# Patient Record
Sex: Male | Born: 1980 | Hispanic: No | Marital: Married | Smoking: Former smoker
Health system: Southern US, Community
[De-identification: ages and names within clinical notes are randomized; demographics above are authoritative.]

---

## 2007-03-05 ENCOUNTER — Ambulatory Visit: Payer: Self-pay | Admitting: Internal Medicine

## 2007-03-05 LAB — CONVERTED CEMR LAB
BUN: 12 mg/dL (ref 6–23)
CO2: 33 meq/L — ABNORMAL HIGH (ref 19–32)
Calcium: 9.8 mg/dL (ref 8.4–10.5)
Creatinine, Ser: 0.8 mg/dL (ref 0.4–1.5)
Glucose, Bld: 87 mg/dL (ref 70–99)
TSH: 1.05 microintl units/mL (ref 0.35–5.50)

## 2016-01-01 ENCOUNTER — Emergency Department (HOSPITAL_COMMUNITY)
Admission: EM | Admit: 2016-01-01 | Discharge: 2016-01-01 | Disposition: A | Discharge status: Home / Self Care | Payer: Self-pay | Attending: Emergency Medicine | Admitting: Emergency Medicine

## 2016-01-01 ENCOUNTER — Encounter (HOSPITAL_COMMUNITY): Payer: Self-pay

## 2016-01-01 DIAGNOSIS — K029 Dental caries, unspecified: Secondary | ICD-10-CM | POA: Insufficient documentation

## 2016-01-01 DIAGNOSIS — Z87891 Personal history of nicotine dependence: Secondary | ICD-10-CM | POA: Insufficient documentation

## 2016-01-01 DIAGNOSIS — K0889 Other specified disorders of teeth and supporting structures: Secondary | ICD-10-CM

## 2016-01-01 MED ORDER — TRAMADOL HCL 50 MG PO TABS: 50.0000 mg | ORAL_TABLET | Freq: Four times a day (QID) | ORAL | Status: DC | PRN

## 2016-01-01 MED ORDER — AMOXICILLIN 500 MG PO CAPS
500.0000 mg | ORAL_CAPSULE | Freq: Once | ORAL | Status: AC
  Administered 2016-01-01: 500 mg via ORAL
  Filled 2016-01-01: qty 1

## 2016-01-01 MED ORDER — AMOXICILLIN 500 MG PO CAPS: 500.0000 mg | ORAL_CAPSULE | Freq: Three times a day (TID) | ORAL | Status: DC

## 2016-01-01 MED ORDER — TRAMADOL HCL 50 MG PO TABS
50.0000 mg | ORAL_TABLET | Freq: Once | ORAL | Status: AC
  Administered 2016-01-01: 50 mg via ORAL
  Filled 2016-01-01: qty 1

## 2016-01-01 NOTE — ED Notes (Signed)
Pt here for dental pain to one of his left upper molars. States there is hole in it.

## 2016-01-01 NOTE — ED Provider Notes (Signed)
CSN: [id number]     Arrival date & time 01/01/16  0111 History  By signing my name below, I, Freida Busman, attest that this documentation has been prepared under the direction and in the presence of Gilda Crease, MD . Electronically Signed: Freida Busman, Scribe. 01/01/2016. 2:30 AM.    Chief Complaint  Patient presents with  . Dental Pain   The history is provided by the patient. No language interpreter was used.     HPI Comments:  Henry Howard is a 35 y.o. male who presents to the Emergency Department complaining of left upper dental pain x 4 days; notes the pain waxes and wanes in severity. Pt has appointment with dentist scheduled for  01/07/16. No alleviating factors noted or associated symptoms noted.   History reviewed. No pertinent past medical history. History reviewed. No pertinent past surgical history. No family history on file. Social History  Substance Use Topics  . Smoking status: Former Games developer  . Smokeless tobacco: None  . Alcohol Use: No    Review of Systems  Constitutional: Negative for fever.  HENT: Positive for dental problem.     Allergies  Review of patient's allergies indicates no known allergies.  Home Medications   Prior to Admission medications   Medication Sig Start Date End Date Taking? Authorizing Provider  amoxicillin (AMOXIL) 500 MG capsule Take 1 capsule (500 mg total) by mouth 3 (three) times daily. 01/01/16   Gilda Crease, MD  traMADol (ULTRAM) 50 MG tablet Take 1 tablet (50 mg total) by mouth every 6 (six) hours as needed. 01/01/16   Gilda Crease, MD   BP 157/92 mmHg  Pulse 84  Temp(Src) 98.6 F (37 C)  Resp 16  SpO2 100% Physical Exam  Constitutional: He is oriented to person, place, and time. He appears well-developed and well-nourished. No distress.  HENT:  Head: Normocephalic and atraumatic.  Right Ear: Hearing normal.  Left Ear: Hearing normal.  Nose: Nose normal.  Mouth/Throat: Oropharynx is clear  and moist and mucous membranes are normal.    Eyes: Conjunctivae and EOM are normal. Pupils are equal, round, and reactive to light.  Neck: Normal range of motion. Neck supple.  Cardiovascular: Regular rhythm, S1 normal and S2 normal.  Exam reveals no gallop and no friction rub.   No murmur heard. Pulmonary/Chest: Effort normal and breath sounds normal. No respiratory distress. He exhibits no tenderness.  Abdominal: Soft. Normal appearance and bowel sounds are normal. There is no hepatosplenomegaly. There is no tenderness. There is no rebound, no guarding, no tenderness at McBurney's point and negative Murphy's sign. No hernia.  Musculoskeletal: Normal range of motion.  Neurological: He is alert and oriented to person, place, and time. He has normal strength. No cranial nerve deficit or sensory deficit. Coordination normal. GCS eye subscore is 4. GCS verbal subscore is 5. GCS motor subscore is 6.  Skin: Skin is warm, dry and intact. No rash noted. No cyanosis.  Psychiatric: He has a normal mood and affect. His speech is normal and behavior is normal. Thought content normal.  Nursing note and vitals reviewed.   ED Course  Procedures   DIAGNOSTIC STUDIES:  Oxygen Saturation is 100% on RA, normal by my interpretation.    COORDINATION OF CARE:  2:02 AM Will discharge with antibiotic. Advised pt to keep dental appointment.  Discussed treatment plan with pt at bedside and pt agreed to plan.    MDM   Final diagnoses:  Toothache    Presents  with toothache. Exam shows cary with no abscess. Has f/u with dentist. Treat with Amox, ultram.  I personally performed the services described in this documentation, which was scribed in my presence. The recorded information has been reviewed and is accurate.    Gilda Crease, MD 01/01/16 0230

## 2016-06-24 ENCOUNTER — Ambulatory Visit: Payer: Self-pay

## 2016-06-24 ENCOUNTER — Other Ambulatory Visit: Payer: Self-pay | Admitting: Occupational Medicine

## 2016-06-24 DIAGNOSIS — Z0000 Encounter for general adult medical examination without abnormal findings: Secondary | ICD-10-CM

## 2017-05-08 IMAGING — CR DG CHEST 1V
1 series · 1 of 1 positions shown · non-contrast
Comparison: None.

CLINICAL DATA: Pre-employment physical

EXAM:
CHEST 1 VIEW

[view not recorded]
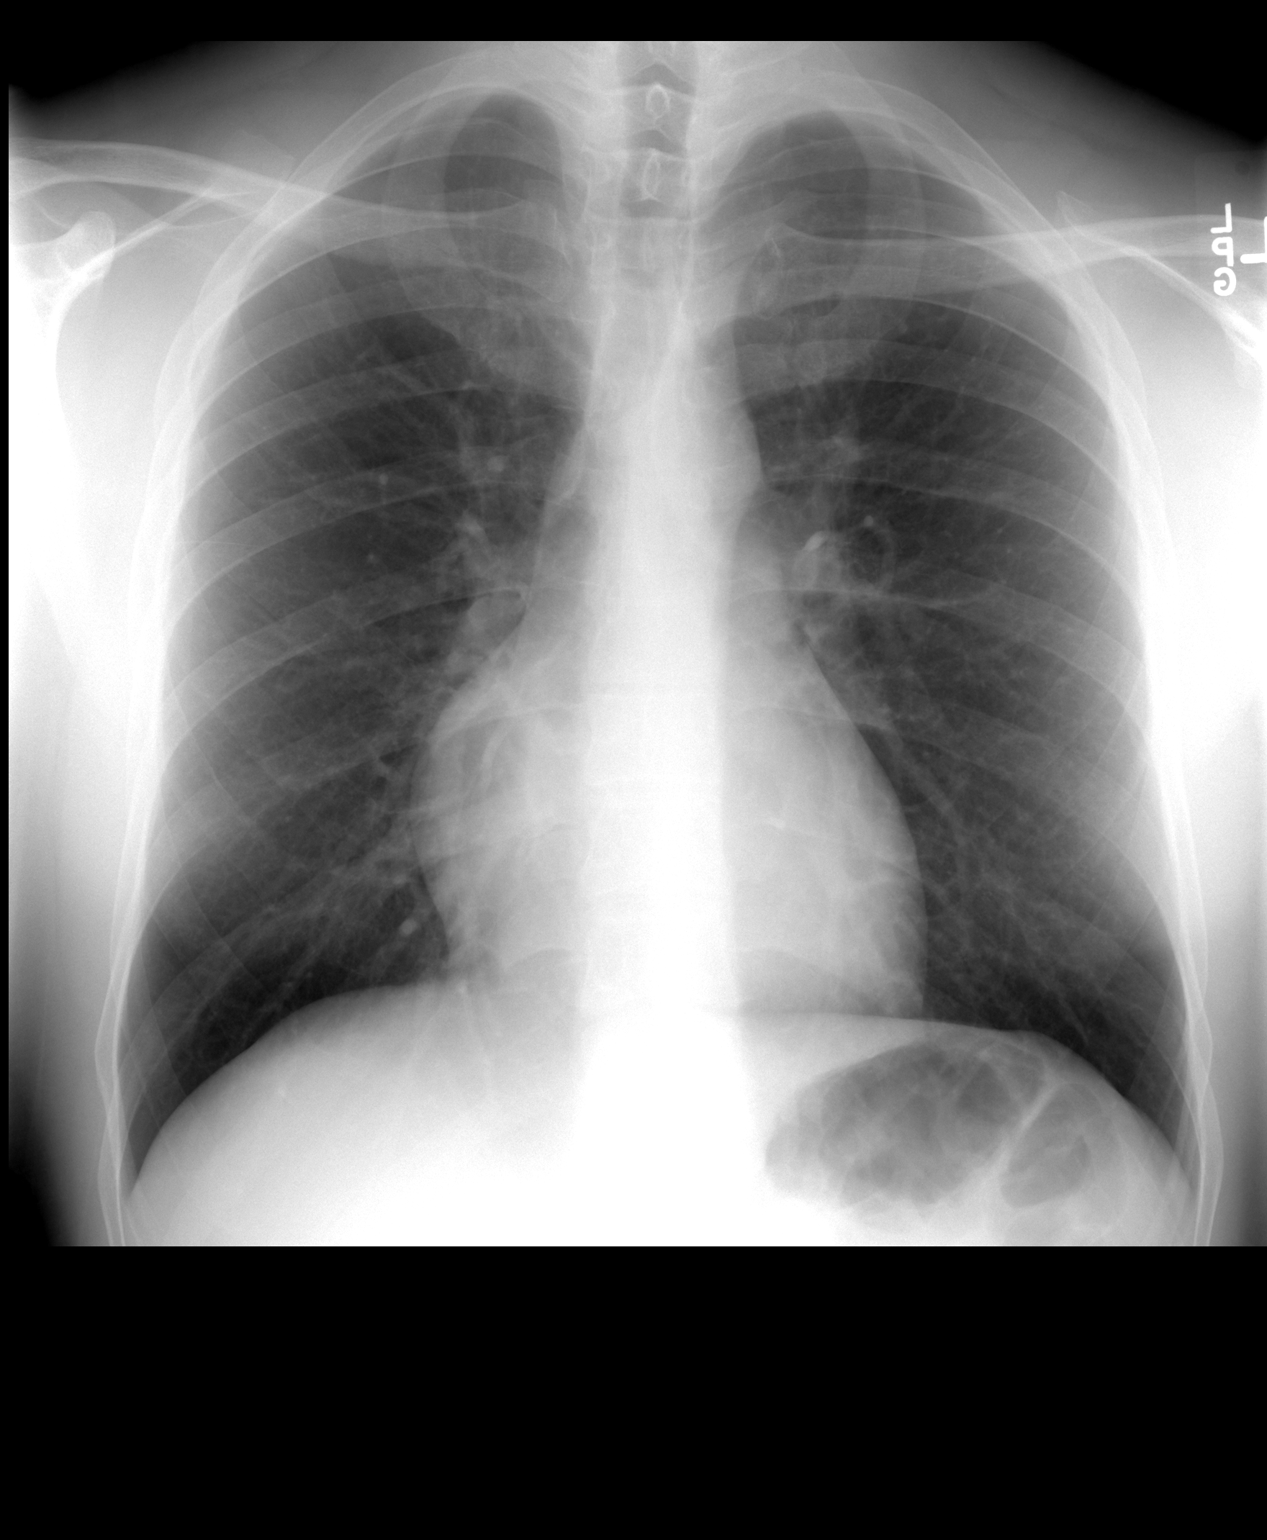

[1 of 1 positions shown; findings below may reference images not displayed]

FINDINGS: Lungs are clear.  No pleural effusion or pneumothorax.

The heart is normal in size.
IMPRESSION: No evidence of acute cardiopulmonary disease.

## 2017-11-23 DIAGNOSIS — J3489 Other specified disorders of nose and nasal sinuses: Secondary | ICD-10-CM | POA: Diagnosis not present

## 2017-11-23 DIAGNOSIS — H9192 Unspecified hearing loss, left ear: Secondary | ICD-10-CM | POA: Diagnosis not present

## 2017-11-23 DIAGNOSIS — K219 Gastro-esophageal reflux disease without esophagitis: Secondary | ICD-10-CM | POA: Diagnosis not present

## 2018-04-03 DIAGNOSIS — Z0000 Encounter for general adult medical examination without abnormal findings: Secondary | ICD-10-CM | POA: Diagnosis not present

## 2018-04-03 DIAGNOSIS — R7301 Impaired fasting glucose: Secondary | ICD-10-CM | POA: Diagnosis not present

## 2018-04-03 DIAGNOSIS — Z13220 Encounter for screening for lipoid disorders: Secondary | ICD-10-CM | POA: Diagnosis not present

## 2019-06-10 DIAGNOSIS — Z3141 Encounter for fertility testing: Secondary | ICD-10-CM | POA: Diagnosis not present

## 2019-10-24 DIAGNOSIS — M25561 Pain in right knee: Secondary | ICD-10-CM | POA: Diagnosis not present

## 2019-10-24 DIAGNOSIS — M25562 Pain in left knee: Secondary | ICD-10-CM | POA: Diagnosis not present

## 2019-11-22 DIAGNOSIS — M25562 Pain in left knee: Secondary | ICD-10-CM | POA: Diagnosis not present

## 2019-11-22 DIAGNOSIS — M25561 Pain in right knee: Secondary | ICD-10-CM | POA: Diagnosis not present

## 2020-02-04 DIAGNOSIS — H5203 Hypermetropia, bilateral: Secondary | ICD-10-CM | POA: Diagnosis not present

## 2021-11-11 NOTE — Progress Notes (Signed)
Anesthesia Review:  PCP: Cardiologist : Chest x-ray : EKG : Echo : Stress test: Cardiac Cath :  Activity level:  Sleep Study/ CPAP : Fasting Blood Sugar :      / Checks Blood Sugar -- times a day:   Blood Thinner/ Instructions /Last Dose: ASA / Instructions/ Last Dose :  

## 2021-11-11 NOTE — Progress Notes (Signed)
Your procedure is scheduled on:                11/18/2021   Report to [facility] Main  Entrance   Report to admitting at    1030AM     Call this number if you have problems the morning of surgery [phone]    REMEMBER: NO  SOLID FOOD CANDY OR GUM AFTER MIDNIGHT. CLEAR LIQUIDS UNTIL   0945am         . NOTHING BY MOUTH EXCEPT CLEAR LIQUIDS UNTIL0945am    . PLEASE FINISH ENSURE DRINK PER SURGEON ORDER  WHICH NEEDS TO BE COMPLETED AT      .  0945am     CLEAR LIQUID DIET   Foods Allowed                                                                    Coffee and tea, regular and decaf                            Fruit ices (not with fruit pulp)                                      Iced Popsicles                                    Carbonated beverages, regular and diet                                    Cranberry, grape and apple juices Sports drinks like Gatorade Lightly seasoned clear broth or consume(fat free) Sugar, honey syrup ___________________________________________________________________      BRUSH YOUR TEETH MORNING OF SURGERY AND RINSE YOUR MOUTH OUT, NO CHEWING GUM CANDY OR MINTS.     Take these medicines the morning of surgery with A SIP OF WATER:  none   DO NOT TAKE ANY DIABETIC MEDICATIONS DAY OF YOUR SURGERY                               You may not have any metal on your body including hair pins and              piercings  Do not wear jewelry, make-up, lotions, powders or perfumes, deodorant             Do not wear nail polish on your fingernails.  Do not shave  48 hours prior to surgery.              Men may shave face and neck.   Do not bring valuables to the hospital. [facility] IS NOT             RESPONSIBLE   FOR VALUABLES.  Contacts, dentures or bridgework may not be worn into surgery.  Leave suitcase in the car. After surgery it may be brought to your room.     Patients  discharged the day of surgery will not be allowed to drive  home. IF YOU ARE HAVING SURGERY AND GOING HOME THE SAME DAY, YOU MUST HAVE AN ADULT TO DRIVE YOU HOME AND BE WITH YOU FOR 24 HOURS. YOU MAY GO HOME BY TAXI OR UBER OR ORTHERWISE, BUT AN ADULT MUST ACCOMPANY YOU HOME AND STAY WITH YOU FOR 24 HOURS.  Name and phone number of your driver:  Special Instructions: N/A              Please read over the following fact sheets you were given: _____________________________________________________________________  [facility] - Preparing for Surgery Before surgery, you can play an important role.  Because skin is not sterile, your skin needs to be as free of germs as possible.  You can reduce the number of germs on your skin by washing with CHG (chlorahexidine gluconate) soap before surgery.  CHG is an antiseptic cleaner which kills germs and bonds with the skin to continue killing germs even after washing. Please DO NOT use if you have an allergy to CHG or antibacterial soaps.  If your skin becomes reddened/irritated stop using the CHG and inform your nurse when you arrive at Short Stay. Do not shave (including legs and underarms) for at least 48 hours prior to the first CHG shower.  You may shave your face/neck. Please follow these instructions carefully:  1.  Shower with CHG Soap the night before surgery and the  morning of Surgery.  2.  If you choose to wash your hair, wash your hair first as usual with your  normal  shampoo.  3.  After you shampoo, rinse your hair and body thoroughly to remove the  shampoo.                           4.  Use CHG as you would any other liquid soap.  You can apply chg directly  to the skin and wash                       Gently with a scrungie or clean washcloth.  5.  Apply the CHG Soap to your body ONLY FROM THE NECK DOWN.   Do not use on face/ open                           Wound or open sores. Avoid contact with eyes, ears mouth and genitals (private parts).                       Wash face,  Genitals (private parts) with  your normal soap.             6.  Wash thoroughly, paying special attention to the area where your surgery  will be performed.  7.  Thoroughly rinse your body with warm water from the neck down.  8.  DO NOT shower/wash with your normal soap after using and rinsing off  the CHG Soap.                9.  Pat yourself dry with a clean towel.            10.  Wear clean pajamas.            11.  Place clean sheets on your bed the night of your first shower and do not  sleep with pets.  Day of Surgery : Do not apply any lotions/deodorants the morning of surgery.  Please wear clean clothes to the hospital/surgery center.  FAILURE TO FOLLOW THESE INSTRUCTIONS MAY RESULT IN THE CANCELLATION OF YOUR SURGERY PATIENT SIGNATURE_________________________________  NURSE SIGNATURE__________________________________  ________________________________________________________________________

## 2021-11-12 ENCOUNTER — Encounter (HOSPITAL_COMMUNITY)
Admission: RE | Admit: 2021-11-12 | Discharge: 2021-11-12 | Disposition: A | Discharge status: Home / Self Care | Payer: BC Managed Care – PPO | Source: Ambulatory Visit | Attending: Anesthesiology | Admitting: Anesthesiology

## 2021-11-12 DIAGNOSIS — Z01818 Encounter for other preprocedural examination: Secondary | ICD-10-CM

## 2021-11-18 ENCOUNTER — Ambulatory Visit (HOSPITAL_COMMUNITY)
Admission: RE | Admit: 2021-11-18 | Payer: BC Managed Care – PPO | Source: Home / Self Care | Admitting: Orthopedic Surgery

## 2021-11-18 ENCOUNTER — Encounter (HOSPITAL_COMMUNITY): Admission: RE | Payer: Self-pay | Source: Home / Self Care

## 2021-11-18 SURGERY — ARTHROSCOPY, KNEE, WITH MEDIAL MENISCECTOMY
Anesthesia: Choice | Site: Knee | Laterality: Left

## 2022-11-27 ENCOUNTER — Ambulatory Visit (HOSPITAL_COMMUNITY)
Admission: EM | Admit: 2022-11-27 | Discharge: 2022-11-27 | Disposition: A | Discharge status: Home / Self Care | Payer: BC Managed Care – PPO | Attending: Emergency Medicine | Admitting: Emergency Medicine

## 2022-11-27 ENCOUNTER — Encounter (HOSPITAL_COMMUNITY): Payer: Self-pay | Admitting: Emergency Medicine

## 2022-11-27 DIAGNOSIS — J069 Acute upper respiratory infection, unspecified: Secondary | ICD-10-CM | POA: Diagnosis not present

## 2022-11-27 MED ORDER — BENZONATATE 100 MG PO CAPS: 100.0000 mg | ORAL_CAPSULE | Freq: Three times a day (TID) | ORAL | 0 refills | Status: DC

## 2022-11-27 MED ORDER — AZITHROMYCIN 250 MG PO TABS: 250.0000 mg | ORAL_TABLET | Freq: Every day | ORAL | 0 refills | Status: DC

## 2022-11-27 MED ORDER — PROMETHAZINE-DM 6.25-15 MG/5ML PO SYRP: 5.0000 mL | ORAL_SOLUTION | Freq: Every evening | ORAL | 0 refills | Status: DC | PRN

## 2022-11-27 MED ORDER — PREDNISONE 20 MG PO TABS: 40.0000 mg | ORAL_TABLET | Freq: Every day | ORAL | 0 refills | Status: DC

## 2022-11-27 MED ORDER — ALBUTEROL SULFATE HFA 108 (90 BASE) MCG/ACT IN AERS: 2.0000 | INHALATION_SPRAY | Freq: Four times a day (QID) | RESPIRATORY_TRACT | 2 refills | Status: DC | PRN

## 2022-11-27 NOTE — Discharge Instructions (Signed)
Today you are being treated for inflammation to your upper airways  Take azithromycin as directed to provide coverage for bacteria  Begin use of prednisone every morning with food for 5 days to help reduce inflammation   May use albuterol inhaler taking 2 puffs every 4 hours as needed to help calm shortness of breath and wheezing  You may use Tessalon pill every 8 hours to help calm your coughing  May use promethazine DM for coughing and additional comfort, be mindful this medication may make you drowsy  For worsening signs of breathing please go to the nearest emergency department for evaluation  In addition:  Maintaining adequate hydration may help to thin secretions and soothe the respiratory mucosa   Warm Liquids- Ingestion of warm liquids may have a soothing effect on the respiratory mucosa, increase the flow of nasal mucus, and loosen respiratory secretions, making them easier to remove  May try honey (2.5 to 5 mL [0.5 to 1 teaspoon]) can be given straight or diluted in liquid (juice). Corn syrup may be substituted if honey is not available.

## 2022-11-27 NOTE — ED Provider Notes (Signed)
[facility]    CSN: [id number] Arrival date & time: 11/27/22  1435      History   Chief Complaint Chief Complaint  Patient presents with   Shortness of Breath   Cough   Fever    HPI Henry Howard is a 41 y.o. male.   Patient presents for evaluation of nasal congestion, rhinorrhea, productive cough, shortness of breath, wheezing and fever beginning 2 days ago.  Shortness of breath is experienced with rest, worsened by exertion, wheezing is heard when there is a sensation of mucus within the throat, clears with coughing.  Sputum is yellow to green in color.  Fever peaking at 100.5.  Decreased appetite but tolerating fluids.  No known sick contact.  Has attempted use of ibuprofen, Tylenol and Alka-Seltzer plus.  Vapes.   History reviewed. No pertinent past medical history.  There are no problems to display for this patient.   History reviewed. No pertinent surgical history.     Home Medications    Prior to Admission medications   Medication Sig Start Date End Date Taking? Authorizing Provider  amoxicillin (AMOXIL) 500 MG capsule Take 1 capsule (500 mg total) by mouth 3 (three) times daily. 01/01/16   Gilda Crease, MD  traMADol (ULTRAM) 50 MG tablet Take 1 tablet (50 mg total) by mouth every 6 (six) hours as needed. 01/01/16   Gilda Crease, MD    Family History History reviewed. No pertinent family history.  Social History Social History   Tobacco Use   Smoking status: Former  Substance Use Topics   Alcohol use: No   Drug use: No     Allergies   Patient has no known allergies.   Review of Systems Review of Systems  Constitutional:  Positive for fever. Negative for activity change, appetite change, chills, diaphoresis, fatigue and unexpected weight change.  HENT:  Positive for congestion, rhinorrhea, sore throat and voice change. Negative for dental problem, drooling, ear discharge, ear pain, facial swelling, hearing loss, mouth  sores, nosebleeds, postnasal drip, sinus pressure, sinus pain, sneezing, tinnitus and trouble swallowing.   Respiratory:  Positive for cough, shortness of breath and wheezing. Negative for apnea, choking, chest tightness and stridor.   Cardiovascular: Negative.   Gastrointestinal: Negative.      Physical Exam Triage Vital Signs ED Triage Vitals  Enc Vitals Group     BP 11/27/22 1541 111/70     Pulse Rate 11/27/22 1541 90     Resp 11/27/22 1541 16     Temp 11/27/22 1541 98.1 F (36.7 C)     Temp Source 11/27/22 1541 Oral     SpO2 11/27/22 1541 95 %     Weight --      Height --      Head Circumference --      Peak Flow --      Pain Score 11/27/22 1545 8     Pain Loc --      Pain Edu? --      Excl. in [place]? --    No data found.  Updated Vital Signs BP 111/70 (BP Location: Right Arm)   Pulse 90   Temp 98.1 F (36.7 C) (Oral)   Resp 16   SpO2 95%   Visual Acuity Right Eye Distance:   Left Eye Distance:   Bilateral Distance:    Right Eye Near:   Left Eye Near:    Bilateral Near:     Physical Exam Constitutional:  Appearance: Normal appearance.  HENT:     Head: Normocephalic.     Right Ear: Tympanic membrane, ear canal and external ear normal.     Left Ear: Tympanic membrane, ear canal and external ear normal.     Nose: Congestion and rhinorrhea present.     Mouth/Throat:     Mouth: Mucous membranes are moist.     Pharynx: Oropharynx is clear.  Eyes:     Extraocular Movements: Extraocular movements intact.  Cardiovascular:     Rate and Rhythm: Normal rate and regular rhythm.     Pulses: Normal pulses.     Heart sounds: Normal heart sounds.  Pulmonary:     Effort: Pulmonary effort is normal.     Breath sounds: Normal breath sounds.  Skin:    General: Skin is warm and dry.  Neurological:     Mental Status: He is alert and oriented to person, place, and time. Mental status is at baseline.  Psychiatric:        Mood and Affect: Mood normal.         Behavior: Behavior normal.      UC Treatments / Results  Labs (all labs ordered are listed, but only abnormal results are displayed) Labs Reviewed - No data to display  EKG   Radiology No results found.  Procedures Procedures (including critical care time)  Medications Ordered in UC Medications - No data to display  Initial Impression / Assessment and Plan / UC Course  I have reviewed the triage vital signs and the nursing notes.  Pertinent labs & imaging results that were available during my care of the patient were reviewed by me and considered in my medical decision making (see chart for details).  Acute upper respiratory infection  Vital signs are stable patient is in no signs of distress nor toxic appearing, initially wheezing heard to the bilateral upper lobes, cleared after coughing, O2 saturations greater than 90% on room air, due to increasing respiratory symptoms we will provide bacterial coverage, Z-Pak prescribed, prescribed Tessalon, Promethazine DM, prednisone and albuterol inhaler for additional supportive management, may use additional over-the-counter medications as deemed helpful, may follow-up with his urgent care if symptoms persist or worsen Final Clinical Impressions(s) / UC Diagnoses   Final diagnoses:  None   Discharge Instructions   None    ED Prescriptions   None    PDMP not reviewed this encounter.   Valinda Hoar, [place] 11/27/22 [phone]

## 2022-11-27 NOTE — ED Triage Notes (Addendum)
Shortness of breath, fever, coughing, "feels like a shoe in my throat," wife reports very loud wheezing, body aches "feels like I was hit by a bus." Has had a recent sick exposure. Vapes. Denies hx of heart or lung problems. "Feels like somebody is choking me." Last took ibuprofen, 400 mg, around 11 am. Also taking alkaseltzer flu and cold medication.   Reports feeling as though there is a stricture near his jugular notch. No visible obstruction in posterior oropharnx, no obvious swelling visible to external throat/jaw/oral structures. No wheezing heard across the room while assessing patient. Appears calm, non-agitated throughout assessment.

## 2023-01-13 ENCOUNTER — Encounter (HOSPITAL_COMMUNITY): Payer: Self-pay | Admitting: *Deleted

## 2023-01-13 ENCOUNTER — Ambulatory Visit (HOSPITAL_COMMUNITY)
Admission: EM | Admit: 2023-01-13 | Discharge: 2023-01-13 | Disposition: A | Discharge status: Home / Self Care | Payer: BC Managed Care – PPO | Attending: Emergency Medicine | Admitting: Emergency Medicine

## 2023-01-13 DIAGNOSIS — J329 Chronic sinusitis, unspecified: Secondary | ICD-10-CM

## 2023-01-13 DIAGNOSIS — R0981 Nasal congestion: Secondary | ICD-10-CM

## 2023-01-13 MED ORDER — AMOXICILLIN-POT CLAVULANATE 875-125 MG PO TABS: 1.0000 | ORAL_TABLET | Freq: Two times a day (BID) | ORAL | 0 refills | Status: AC

## 2023-01-13 NOTE — ED Provider Notes (Signed)
[facility]    CSN: [id number] Arrival date & time: 01/13/23  1046     History   Chief Complaint Chief Complaint  Patient presents with   Nasal Congestion   Ear Fullness    HPI Henry Howard is a 42 y.o. male.  Presents with 76-monthhistory of nasal congestion, ear fullness Reports on and off feeling swelling of his neck "Full" sensation in his nose No fevers.  No cough.  Denies nasal drainage He has tried Zyrtec, saline, Afrin, ibuprofen.    No known sick contacts Reports history of seasonal allergies but never lasts this long  History reviewed. No pertinent past medical history.  There are no problems to display for this patient.  History reviewed. No pertinent surgical history.   Home Medications    Prior to Admission medications   Medication Sig Start Date End Date Taking? Authorizing Provider  amoxicillin-clavulanate (AUGMENTIN) 875-125 MG tablet Take 1 tablet by mouth 2 (two) times daily for 5 days. 01/13/23 01/18/23 Yes O[place]ale Marney, RVernice Jefferson   Family History History reviewed. No pertinent family history.  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 --      [place]    No data found.  Updated Vital Signs BP 135/80 (BP Location: Left Arm)   Pulse 85   Temp 98.6 F (37 C) (Oral)   Resp 18   SpO2 94%    Physical Exam Vitals and  nursing note reviewed.  Constitutional:      General: He is not in acute distress. HENT:     Right Ear: Hearing, tympanic membrane, ear canal and external ear normal.     Left Ear: Hearing, tympanic membrane, ear canal and external ear normal.     Nose: No septal deviation or rhinorrhea.     Right Turbinates: Enlarged.     Left Turbinates: Enlarged.     Right Sinus: No frontal sinus tenderness.     Left Sinus: No frontal sinus tenderness.     Mouth/Throat:     Mouth: Mucous membranes are moist.     Pharynx: Oropharynx is clear. No posterior oropharyngeal erythema.  Eyes:     Conjunctiva/sclera: Conjunctivae normal.  Cardiovascular:     Rate and Rhythm: Normal rate and regular rhythm.     Pulses: Normal pulses.     Heart sounds: Normal heart sounds.  Pulmonary:     Effort: Pulmonary effort is normal.     Breath sounds: Normal breath sounds.  Musculoskeletal:  Cervical back: Full passive range of motion without pain. No muscular tenderness.  Lymphadenopathy:     Cervical: No cervical adenopathy.  Neurological:     Mental Status: He is alert and oriented to person, place, and time.     UC Treatments / Results  Labs (all labs ordered are listed, but only abnormal results are displayed) Labs Reviewed - No data to display  EKG  Radiology No results found.  Procedures Procedures   Medications Ordered in UC Medications - No data to display  Initial Impression / Assessment and Plan / UC Course  I have reviewed the triage vital signs and the nursing notes.  Pertinent labs & imaging results that were available during my care of the patient were reviewed by me and considered in my medical decision making (see chart for details).  With 2 month duration cover for bacterial etiology with augmentin BID x 5 days Recommend to restart daily zyrtec and add once daily flonase Discussed could have chronic sinusitis component. Recommend close follow up with ENT. Provided clinic  info. Return precautions discussed. Patient agrees to plan  Final Clinical Impressions(s) / UC Diagnoses   Final diagnoses:  Nasal congestion  Chronic sinusitis, unspecified location     Discharge Instructions      Please take medication as prescribed. Take with food to avoid upset stomach.  Please take once daily Zyrtec in combination with once daily nasal spray (Flonase) Drink lots of fluids  Please call the ENT office to schedule an appointment    ED Prescriptions     Medication Sig Dispense Auth. Provider   amoxicillin-clavulanate (AUGMENTIN) 875-125 MG tablet Take 1 tablet by mouth 2 (two) times daily for 5 days. 10 tablet Tyrihanna Wingert, Wells Guiles, PA-C      PDMP not reviewed this encounter.   Emmry Hinsch, Wells Guiles, [place] 01/13/23 1245

## 2023-01-13 NOTE — ED Triage Notes (Signed)
Pt states he has congestion, headache, swollen neck, left ear fullness X 2 months. He has tried saline flushes, zytrec, IBU.

## 2023-01-13 NOTE — Discharge Instructions (Signed)
Please take medication as prescribed. Take with food to avoid upset stomach.  Please take once daily Zyrtec in combination with once daily nasal spray (Flonase) Drink lots of fluids  Please call the ENT office to schedule an appointment
